# Patient Record
Sex: Female | Born: 1985 | Race: White | Hispanic: No | Marital: Married | Smoking: Never smoker
Health system: Southern US, Community
[De-identification: ages and names within clinical notes are randomized; demographics above are authoritative.]

---

## 2012-11-26 ENCOUNTER — Other Ambulatory Visit (HOSPITAL_COMMUNITY)
Admission: RE | Admit: 2012-11-26 | Discharge: 2012-11-26 | Disposition: A | Discharge status: Home / Self Care | Payer: BC Managed Care – PPO | Source: Ambulatory Visit | Attending: Family Medicine | Admitting: Family Medicine

## 2012-11-26 DIAGNOSIS — Z01419 Encounter for gynecological examination (general) (routine) without abnormal findings: Secondary | ICD-10-CM | POA: Insufficient documentation

## 2013-08-04 ENCOUNTER — Emergency Department (HOSPITAL_COMMUNITY)
Admission: EM | Admit: 2013-08-04 | Discharge: 2013-08-04 | Disposition: A | Discharge status: Home / Self Care | Payer: BC Managed Care – PPO | Attending: Emergency Medicine | Admitting: Emergency Medicine

## 2013-08-04 ENCOUNTER — Encounter (HOSPITAL_COMMUNITY): Payer: Self-pay | Admitting: Emergency Medicine

## 2013-08-04 DIAGNOSIS — S0993XA Unspecified injury of face, initial encounter: Secondary | ICD-10-CM | POA: Insufficient documentation

## 2013-08-04 DIAGNOSIS — Y9389 Activity, other specified: Secondary | ICD-10-CM | POA: Insufficient documentation

## 2013-08-04 DIAGNOSIS — S199XXA Unspecified injury of neck, initial encounter: Principal | ICD-10-CM

## 2013-08-04 DIAGNOSIS — Y92410 Unspecified street and highway as the place of occurrence of the external cause: Secondary | ICD-10-CM | POA: Diagnosis not present

## 2013-08-04 DIAGNOSIS — S99919A Unspecified injury of unspecified ankle, initial encounter: Secondary | ICD-10-CM | POA: Diagnosis not present

## 2013-08-04 DIAGNOSIS — S8990XA Unspecified injury of unspecified lower leg, initial encounter: Secondary | ICD-10-CM | POA: Insufficient documentation

## 2013-08-04 DIAGNOSIS — S99929A Unspecified injury of unspecified foot, initial encounter: Secondary | ICD-10-CM

## 2013-08-04 MED ORDER — NAPROXEN 500 MG PO TABS: 500.0000 mg | ORAL_TABLET | Freq: Two times a day (BID) | ORAL | Status: DC

## 2013-08-04 NOTE — ED Notes (Signed)
Patient declines wheelchair at discharge.  Patient escorted to lobby by RN.  
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
## 2013-08-05 NOTE — ED Provider Notes (Signed)
Medical screening examination/treatment/procedure(s) were performed by non-physician practitioner and as supervising physician I was immediately available for consultation/collaboration.   EKG Interpretation None       Elliott L Wentz, MD 08/05/13 1108 

## 2018-10-17 ENCOUNTER — Ambulatory Visit (HOSPITAL_COMMUNITY)
Admission: EM | Admit: 2018-10-17 | Discharge: 2018-10-17 | Disposition: A | Discharge status: Home / Self Care | Payer: BC Managed Care – PPO

## 2018-10-17 ENCOUNTER — Other Ambulatory Visit: Payer: Self-pay

## 2018-10-17 ENCOUNTER — Encounter (HOSPITAL_COMMUNITY): Payer: Self-pay

## 2018-10-17 DIAGNOSIS — L03115 Cellulitis of right lower limb: Secondary | ICD-10-CM | POA: Diagnosis not present

## 2018-10-17 MED ORDER — MUPIROCIN 2 % EX OINT: TOPICAL_OINTMENT | CUTANEOUS | 0 refills | Status: DC

## 2018-10-17 MED ORDER — AMOXICILLIN 500 MG PO CAPS: 500.0000 mg | ORAL_CAPSULE | Freq: Three times a day (TID) | ORAL | 0 refills | Status: DC

## 2018-10-17 NOTE — Discharge Instructions (Signed)
°  Please take antibiotics as prescribed and be sure to complete entire course even if you start to feel better to ensure infection does not come back.  Keep wound clean with warm water and antibacterial soap.  Change bandage at least 3 times a day the first 5 days while using the prescribed antibiotic ointment.  Please follow up in 4-5 days if not improving.
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
## 2018-10-17 NOTE — ED Provider Notes (Signed)
[facility]    CSN: [id number] Arrival date & time: 10/17/18  1824      History   Chief Complaint No chief complaint on file.   HPI Judith Colon is a 33 y.o. female.   HPI  Judith Colon is a 33 y.o. female presenting to UC with c/o 4 days of gradually worsening painful red bump on Right buttock/upper posterior thigh.  She has applied Neosporin with no relief.  She noticed clear drainage yesterday.  Pain is aching and sore. No known insect bite. Denies itching. Denies fever, chills, n/v/d.   No past medical history on file.  There are no active problems to display for this patient.   No past surgical history on file.  OB History   No obstetric history on file.      Home Medications    Prior to Admission medications   Medication Sig Start Date End Date Taking? Authorizing Provider  sertraline (ZOLOFT) 25 MG tablet Take 25 mg by mouth daily.   Yes [provider]  amoxicillin (AMOXIL) 500 MG capsule Take 1 capsule (500 mg total) by mouth 3 (three) times daily. 10/17/18   Lurene ShadowPhelps, Luma Clopper O, PA-C  mupirocin ointment (BACTROBAN) 2 % Apply to wound 3 times daily for 5 days 10/17/18   Lurene ShadowPhelps, Gustie Bobb O, PA-C  naproxen (NAPROSYN) 500 MG tablet Take 1 tablet (500 mg total) by mouth 2 (two) times daily. 08/04/13   Arthor CaptainHarris, Abigail, PA-C    Family History No family history on file.  Social History Social History   Tobacco Use  . Smoking status: Never Smoker  . Smokeless tobacco: Never Used  Substance Use Topics  . Alcohol use: Yes    Comment: 2-3 times a week  . Drug use: No     Allergies   Patient has no known allergies.   Review of Systems Review of Systems  Gastrointestinal: Negative for diarrhea, nausea and vomiting.  Skin: Positive for color change, rash and wound.     Physical Exam Triage Vital Signs ED Triage Vitals  Enc Vitals Group     BP 10/17/18 1839 136/70     Pulse Rate 10/17/18 1839 88     Resp 10/17/18 1839 16     Temp 10/17/18  1839 98.9 F (37.2 C)     Temp Source 10/17/18 1839 Oral     SpO2 10/17/18 1839 100 %     Weight --      Height --      Head Circumference --      Peak Flow --      Pain Score 10/17/18 1843 4     Pain Loc --      Pain Edu? --      Excl. in GC? --    No data found.  Updated Vital Signs BP 136/70 (BP Location: Right Arm)   Pulse 88   Temp 98.9 F (37.2 C) (Oral)   Resp 16   LMP 10/03/2018   SpO2 100%   Visual Acuity Right Eye Distance:   Left Eye Distance:   Bilateral Distance:    Right Eye Near:   Left Eye Near:    Bilateral Near:     Physical Exam Vitals signs and nursing note reviewed.  Constitutional:      Appearance: Normal appearance. She is well-developed.  HENT:     Head: Normocephalic and atraumatic.  Neck:     Musculoskeletal: Normal range of motion.  Cardiovascular:     Rate and  Rhythm: Normal rate.  Pulmonary:     Effort: Pulmonary effort is normal.  Musculoskeletal: Normal range of motion.  Skin:    General: Skin is warm and dry.     Findings: Erythema present.       Neurological:     Mental Status: She is alert and oriented to person, place, and time.  Psychiatric:        Behavior: Behavior normal.      UC Treatments / Results  Labs (all labs ordered are listed, but only abnormal results are displayed) Labs Reviewed - No data to display  EKG   Radiology No results found.  Procedures Procedures (including critical care time)  Medications Ordered in UC Medications - No data to display  Initial Impression / Assessment and Plan / UC Course  I have reviewed the triage vital signs and the nursing notes.  Pertinent labs & imaging results that were available during my care of the patient were reviewed by me and considered in my medical decision making (see chart for details).     Exam c/w cellulitis Will start on antibiotics AVS provided.  Final Clinical Impressions(s) / UC Diagnoses   Final diagnoses:  Cellulitis of right  thigh     Discharge Instructions      Please take antibiotics as prescribed and be sure to complete entire course even if you start to feel better to ensure infection does not come back.  Keep wound clean with warm water and antibacterial soap.  Change bandage at least 3 times a day the first 5 days while using the prescribed antibiotic ointment.  Please follow up in 4-5 days if not improving.      ED Prescriptions    Medication Sig Dispense Auth. Provider   mupirocin ointment (BACTROBAN) 2 % Apply to wound 3 times daily for 5 days 22 g Hawthorne Day O, PA-C   amoxicillin (AMOXIL) 500 MG capsule Take 1 capsule (500 mg total) by mouth 3 (three) times daily. 21 capsule Noe Gens, PA-C     Controlled Substance Prescriptions [place] Controlled Substance Registry consulted? Not Applicable   Tyrell Antonio 10/17/18 1948

## 2018-10-21 ENCOUNTER — Ambulatory Visit (HOSPITAL_COMMUNITY)
Admission: EM | Admit: 2018-10-21 | Discharge: 2018-10-21 | Disposition: A | Discharge status: Home / Self Care | Payer: BC Managed Care – PPO | Attending: Family Medicine | Admitting: Family Medicine

## 2018-10-21 ENCOUNTER — Other Ambulatory Visit: Payer: Self-pay

## 2018-10-21 ENCOUNTER — Encounter (HOSPITAL_COMMUNITY): Payer: Self-pay

## 2018-10-21 DIAGNOSIS — L02416 Cutaneous abscess of left lower limb: Secondary | ICD-10-CM

## 2018-10-21 MED ORDER — CLINDAMYCIN HCL 300 MG PO CAPS: 300.0000 mg | ORAL_CAPSULE | Freq: Three times a day (TID) | ORAL | 0 refills | Status: AC

## 2018-10-21 MED ORDER — HYDROCODONE-ACETAMINOPHEN 5-325 MG PO TABS: 1.0000 | ORAL_TABLET | Freq: Four times a day (QID) | ORAL | 0 refills | Status: DC | PRN

## 2018-10-21 MED ORDER — LIDOCAINE HCL 2 % IJ SOLN
INTRAMUSCULAR | Status: AC
  Filled 2018-10-21: qty 20

## 2018-10-21 NOTE — ED Triage Notes (Signed)
Patient presents to Urgent Care with complaints of needing follow-up for a draining abscess since it was cared for here 3 days ago. Patient reports her father is a Software engineer and wanted to relay the message that she would be better with clindamycin versus the amoxicillin she was prescribed.

## 2018-10-21 NOTE — ED Provider Notes (Signed)
[facility]    CSN: [id number] Arrival date & time: 10/21/18  1924      History   Chief Complaint Chief Complaint  Patient presents with  . Abscess  . Follow-up    HPI Judith Colon is a 33 y.o. female no significant past medical history presenting today for follow-up of an abscess.  Patient was seen here approximately 4 to 5 days ago and treated for cellulitis with amoxicillin.  She is following up today has she is still having persistent issues and the area has started to drain.  She states her pain has improved, but overall the area looks worse.  She denies any fevers chills body aches.  Denies any nausea or vomiting.  Denies lightheadedness or dizziness.  Denies history of previous abscesses.  HPI  History reviewed. No pertinent past medical history.  There are no active problems to display for this patient.   History reviewed. No pertinent surgical history.  OB History   No obstetric history on file.      Home Medications    Prior to Admission medications   Medication Sig Start Date End Date Taking? Authorizing Provider  clindamycin (CLEOCIN) 300 MG capsule Take 1 capsule (300 mg total) by mouth 3 (three) times daily for 7 days. 10/21/18 10/28/18  ,  C, PA-C  HYDROcodone-acetaminophen (NORCO/VICODIN) 5-325 MG tablet Take 1-2 tablets by mouth every 6 (six) hours as needed for severe pain. 10/21/18   ,  C, PA-C  mupirocin ointment (BACTROBAN) 2 % Apply to wound 3 times daily for 5 days 10/17/18   Lurene ShadowPhelps, Erin O, PA-C  naproxen (NAPROSYN) 500 MG tablet Take 1 tablet (500 mg total) by mouth 2 (two) times daily. 08/04/13   Arthor CaptainHarris, Abigail, PA-C  sertraline (ZOLOFT) 25 MG tablet Take 25 mg by mouth daily.    [provider]    Family History Family History  Problem Relation Age of Onset  . Healthy Mother   . Healthy Father     Social History Social History   Tobacco Use  . Smoking status: Never Smoker  . Smokeless tobacco:  Never Used  Substance Use Topics  . Alcohol use: Yes    Comment: 2-3 times a week  . Drug use: No     Allergies   Patient has no known allergies.   Review of Systems Review of Systems  Constitutional: Negative for fatigue and fever.  Eyes: Negative for visual disturbance.  Respiratory: Negative for shortness of breath.   Cardiovascular: Negative for chest pain.  Gastrointestinal: Negative for abdominal pain, nausea and vomiting.  Musculoskeletal: Negative for arthralgias and joint swelling.  Skin: Positive for color change and wound. Negative for rash.  Neurological: Negative for dizziness, weakness, light-headedness and headaches.     Physical Exam Triage Vital Signs ED Triage Vitals  Enc Vitals Group     BP 10/21/18 2019 119/63     Pulse Rate 10/21/18 2019 78     Resp 10/21/18 2019 17     Temp 10/21/18 2019 98.7 F (37.1 C)     Temp Source 10/21/18 2019 Temporal     SpO2 10/21/18 2019 100 %     Weight --      Height --      Head Circumference --      Peak Flow --      Pain Score 10/21/18 2018 3     Pain Loc --      Pain Edu? --  Excl. in GC? --    No data found.  Updated Vital Signs BP 119/63 (BP Location: Left Arm)   Pulse 78   Temp 98.7 F (37.1 C) (Temporal)   Resp 17   LMP 10/03/2018   SpO2 100%   Visual Acuity Right Eye Distance:   Left Eye Distance:   Bilateral Distance:    Right Eye Near:   Left Eye Near:    Bilateral Near:     Physical Exam Vitals signs and nursing note reviewed.  Constitutional:      Appearance: She is well-developed.     Comments: No acute distress  HENT:     Head: Normocephalic and atraumatic.     Nose: Nose normal.  Eyes:     Conjunctiva/sclera: Conjunctivae normal.  Neck:     Musculoskeletal: Neck supple.  Cardiovascular:     Rate and Rhythm: Normal rate.  Pulmonary:     Effort: Pulmonary effort is normal. No respiratory distress.  Abdominal:     General: There is no distension.  Musculoskeletal:  Normal range of motion.  Skin:    General: Skin is warm and dry.     Comments: Large area of induration and erythema to posterior right leg.  Central area slightly open with slight dried pus, does not extend to groin  Neurological:     Mental Status: She is alert and oriented to person, place, and time.      UC Treatments / Results  Labs (all labs ordered are listed, but only abnormal results are displayed) Labs Reviewed - No data to display  EKG   Radiology No results found.  Procedures Incision and Drainage  Date/Time: 10/21/2018 9:30 PM Performed by: , Junius Creamer, PA-C Authorized by: Eustace Moore, MD   Consent:    Consent obtained:  Verbal   Consent given by:  Patient   Risks discussed:  Bleeding, incomplete drainage and pain   Alternatives discussed:  Alternative treatment Location:    Type:  Abscess   Size:  5   Location:  Lower extremity   Lower extremity location:  Leg   Leg location:  R upper leg Pre-procedure details:    Skin preparation:  Betadine Anesthesia (see MAR for exact dosages):    Anesthesia method:  Local infiltration   Local anesthetic:  Lidocaine 2% w/o epi Procedure type:    Complexity:  Simple Procedure details:    Needle aspiration: no     Incision types:  Single straight   Incision depth:  Subcutaneous   Scalpel blade:  11   Wound management:  Probed and deloculated   Drainage:  Purulent and bloody   Drainage amount:  Copious   Wound treatment:  Wound left open   Packing materials:  1/4 in iodoform gauze Post-procedure details:    Patient tolerance of procedure:  Tolerated well, no immediate complications   (including critical care time)  Medications Ordered in UC Medications  lidocaine (XYLOCAINE) 2 % (with pres) injection (has no administration in time range)    Initial Impression / Assessment and Plan / UC Course  I have reviewed the triage vital signs and the nursing notes.  Pertinent labs & imaging results  that were available during my care of the patient were reviewed by me and considered in my medical decision making (see chart for details).     I&D performed on abscess.  Large amount of packing placed.  Switching from amoxicillin to clindamycin to cover for MRSA.  Recommended starting probiotic  to avoid C. difficile.  Warm compresses].  Packing to be slowly removed every 24 hours given size of abscess.  Fully remove packing in 72 hours.  Follow-up if symptoms not resolving or worsening.  Discussed strict return precautions. Patient verbalized understanding and is agreeable with plan.  Final Clinical Impressions(s) / UC Diagnoses   Final diagnoses:  Abscess of left leg     Discharge Instructions     Please begin clindamycin x 1 week  Apply warm compresses/hot rags to area with massage to express further drainage especially the first 24-48 hours  Return if symptoms returning or not improving   ED Prescriptions    Medication Sig Dispense Auth. Provider   clindamycin (CLEOCIN) 300 MG capsule Take 1 capsule (300 mg total) by mouth 3 (three) times daily for 7 days. 21 capsule ,  C, PA-C   HYDROcodone-acetaminophen (NORCO/VICODIN) 5-325 MG tablet Take 1-2 tablets by mouth every 6 (six) hours as needed for severe pain. 8 tablet , [place] C, PA-C     Controlled Substance Prescriptions [place] Controlled Substance Registry consulted? Yes, I have consulted the [place] Controlled Substances Registry for this patient, and feel the risk/benefit ratio today is favorable for proceeding with this prescription for a controlled substance.   Janith Lima, [place] 10/21/18 2132

## 2018-10-21 NOTE — Discharge Instructions (Addendum)
Please begin clindamycin x 1 week  Apply warm compresses/hot rags to area with massage to express further drainage especially the first 24-48 hours  Return if symptoms returning or not improving

## 2019-04-30 ENCOUNTER — Ambulatory Visit: Payer: BC Managed Care – PPO | Attending: Internal Medicine

## 2019-04-30 DIAGNOSIS — Z23 Encounter for immunization: Secondary | ICD-10-CM

## 2019-04-30 NOTE — Progress Notes (Signed)
   Covid-19 Vaccination Clinic  Name:  Judith Colon    MRN: [id number] DOB: Nov 30, 1985  04/30/2019  [name] was observed post Covid-19 immunization for 15 minutes without incident. She was provided with Vaccine Information Sheet and instruction to access the V-Safe system.   [name] was instructed to call 911 with any severe reactions post vaccine: Marland Kitchen Difficulty breathing  . Swelling of face and throat  . A fast heartbeat  . A bad rash all over body  . Dizziness and weakness   Immunizations Administered    Name Date Dose VIS Date Route   Pfizer COVID-19 Vaccine 04/30/2019  3:23 PM 0.3 mL 01/30/2019 Intramuscular   Manufacturer: [employer], [employer]   Lot: [id number]   NDC: [address]-5

## 2019-05-25 ENCOUNTER — Ambulatory Visit: Payer: BC Managed Care – PPO | Attending: Internal Medicine

## 2019-05-25 DIAGNOSIS — Z23 Encounter for immunization: Secondary | ICD-10-CM

## 2019-05-25 NOTE — Progress Notes (Signed)
   Covid-19 Vaccination Clinic  Name:  Judith Colon    MRN: [id number] DOB: 1985/05/11  05/25/2019  [name] was observed post Covid-19 immunization for 15 minutes without incident. She was provided with Vaccine Information Sheet and instruction to access the V-Safe system.   [name] was instructed to call 911 with any severe reactions post vaccine: Marland Kitchen Difficulty breathing  . Swelling of face and throat  . A fast heartbeat  . A bad rash all over body  . Dizziness and weakness   Immunizations Administered    Name Date Dose VIS Date Route   Pfizer COVID-19 Vaccine 05/25/2019 11:06 AM 0.3 mL 01/30/2019 Intramuscular   Manufacturer: [employer], [employer]   Lot: [id number]   NDC: [address]-5

## 2019-06-30 ENCOUNTER — Other Ambulatory Visit (HOSPITAL_COMMUNITY)
Admission: RE | Admit: 2019-06-30 | Discharge: 2019-06-30 | Disposition: A | Discharge status: Home / Self Care | Payer: BC Managed Care – PPO | Source: Ambulatory Visit | Attending: Family Medicine | Admitting: Family Medicine

## 2019-06-30 ENCOUNTER — Other Ambulatory Visit: Payer: Self-pay | Admitting: Family Medicine

## 2019-06-30 DIAGNOSIS — Z124 Encounter for screening for malignant neoplasm of cervix: Secondary | ICD-10-CM | POA: Insufficient documentation

## 2019-07-02 LAB — CYTOLOGY - PAP
Adequacy: ABSENT
Comment: NEGATIVE
Diagnosis: NEGATIVE
High risk HPV: NEGATIVE

## 2019-12-02 ENCOUNTER — Other Ambulatory Visit: Payer: BC Managed Care – PPO

## 2019-12-02 ENCOUNTER — Other Ambulatory Visit: Payer: Self-pay

## 2019-12-02 DIAGNOSIS — Z20822 Contact with and (suspected) exposure to covid-19: Secondary | ICD-10-CM

## 2019-12-03 LAB — SARS-COV-2, NAA 2 DAY TAT

## 2019-12-03 LAB — NOVEL CORONAVIRUS, NAA: SARS-CoV-2, NAA: NOT DETECTED

## 2020-08-05 ENCOUNTER — Emergency Department (HOSPITAL_COMMUNITY): Payer: BC Managed Care – PPO

## 2020-08-05 ENCOUNTER — Encounter (HOSPITAL_COMMUNITY): Payer: Self-pay | Admitting: Emergency Medicine

## 2020-08-05 ENCOUNTER — Emergency Department (HOSPITAL_COMMUNITY)
Admission: EM | Admit: 2020-08-05 | Discharge: 2020-08-05 | Disposition: A | Discharge status: Home / Self Care | Payer: BC Managed Care – PPO | Attending: Emergency Medicine | Admitting: Emergency Medicine

## 2020-08-05 DIAGNOSIS — G932 Benign intracranial hypertension: Secondary | ICD-10-CM | POA: Insufficient documentation

## 2020-08-05 DIAGNOSIS — H53132 Sudden visual loss, left eye: Secondary | ICD-10-CM | POA: Diagnosis present

## 2020-08-05 LAB — CBC WITH DIFFERENTIAL/PLATELET
Abs Immature Granulocytes: 0.01 10*3/uL (ref 0.00–0.07)
Basophils Absolute: 0.1 10*3/uL (ref 0.0–0.1)
Basophils Relative: 1 %
Eosinophils Absolute: 0.2 10*3/uL (ref 0.0–0.5)
Eosinophils Relative: 2 %
HCT: 37.5 % (ref 36.0–46.0)
Hemoglobin: 11.9 g/dL — ABNORMAL LOW (ref 12.0–15.0)
Immature Granulocytes: 0 %
Lymphocytes Relative: 34 %
Lymphs Abs: 2.5 10*3/uL (ref 0.7–4.0)
MCH: 27.5 pg (ref 26.0–34.0)
MCHC: 31.7 g/dL (ref 30.0–36.0)
MCV: 86.6 fL (ref 80.0–100.0)
Monocytes Absolute: 0.5 10*3/uL (ref 0.1–1.0)
Monocytes Relative: 7 %
Neutro Abs: 4.1 10*3/uL (ref 1.7–7.7)
Neutrophils Relative %: 56 %
Platelets: 241 10*3/uL (ref 150–400)
RBC: 4.33 MIL/uL (ref 3.87–5.11)
RDW: 12.8 % (ref 11.5–15.5)
WBC: 7.3 10*3/uL (ref 4.0–10.5)
nRBC: 0 % (ref 0.0–0.2)

## 2020-08-05 LAB — CSF CELL COUNT WITH DIFFERENTIAL
RBC Count, CSF: 0 /mm3
RBC Count, CSF: 2 /mm3 — ABNORMAL HIGH
Tube #: 1
Tube #: 4
WBC, CSF: 1 /mm3 (ref 0–5)
WBC, CSF: 1 /mm3 (ref 0–5)

## 2020-08-05 LAB — BASIC METABOLIC PANEL
Anion gap: 8 (ref 5–15)
BUN: 10 mg/dL (ref 6–20)
CO2: 23 mmol/L (ref 22–32)
Calcium: 9.1 mg/dL (ref 8.9–10.3)
Chloride: 108 mmol/L (ref 98–111)
Creatinine, Ser: 0.91 mg/dL (ref 0.44–1.00)
GFR, Estimated: >60 mL/min (ref >60)
Glucose, Bld: 100 mg/dL — ABNORMAL HIGH (ref 70–99)
Potassium: 4.1 mmol/L (ref 3.5–5.1)
Sodium: 139 mmol/L (ref 135–145)

## 2020-08-05 LAB — PROTEIN, CSF: Total  Protein, CSF: 26 mg/dL (ref 15–45)

## 2020-08-05 LAB — GLUCOSE, CSF: Glucose, CSF: 55 mg/dL (ref 40–70)

## 2020-08-05 MED ORDER — ACETAZOLAMIDE 250 MG PO TABS: 500.0000 mg | ORAL_TABLET | Freq: Two times a day (BID) | ORAL | 0 refills | Status: DC

## 2020-08-05 MED ORDER — LIDOCAINE HCL (PF) 1 % IJ SOLN
5.0000 mL | Freq: Once | INTRAMUSCULAR | Status: AC
  Administered 2020-08-05: 3 mL

## 2020-08-05 MED ORDER — SODIUM CHLORIDE 0.9 % IV BOLUS
1000.0000 mL | Freq: Once | INTRAVENOUS | Status: AC
  Administered 2020-08-05: 1000 mL via INTRAVENOUS

## 2020-08-05 MED ORDER — GADOBUTROL 1 MMOL/ML IV SOLN
10.0000 mL | Freq: Once | INTRAVENOUS | Status: AC | PRN
  Administered 2020-08-05: 10 mL via INTRAVENOUS

## 2020-08-05 NOTE — ED Provider Notes (Signed)
Care of the patient assumed at the change of shift. Here for visual changes and papilledema seen on outpatient ophtho exam. 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   I discussed the case with Dr. Otelia Limes with Neurology who recommends initiating Diamox 500mg  PO BID with close out patient follow up with Ophtho and Neurology for monitoring of pressures and long term management of her symptoms. Patient understands that poorly controlled intracranial pressures could lead to permanent blindness. She was counseled to lose weight as this may also help prevent symptoms.   Encouraged to return to the ED for any other concerns.    , MD 08/05/20 [phone]

## 2020-08-05 NOTE — ED Provider Notes (Signed)
Emergency Medicine Provider Triage Evaluation Note  Judith Colon , a 35 y.o. female  was evaluated in triage. Sent from ophthalmology for evaluation of optic nerve edema bilaterally. For the past month whenever she stands up or bends over her vision in her left eye will go out for a couple of seconds and then return. She sometimes has the same thing in her right eye. Saw ophthalmology today and was sent here for further workup. See below for recommendations. Pt also has paperwork with her for recommendations.   Review of Systems  Positive: + vision loss in left eye with position change, headaches Negative: - fevers, confusion  Physical Exam  BP 140/78 (BP Location: Right Arm)   Pulse 76   Temp 98.7 F (37.1 C) (Oral)   Resp 16   SpO2 97%  Gen:   Awake, no distress   Resp:  Normal effort  MSK:   Moves extremities without difficulty  Other:    Medical Decision Making  Medically screening exam initiated at 10:17 AM.  Appropriate orders placed.  Judith Colon was informed that the remainder of the evaluation will be completed by another provider, this initial triage assessment does not replace that evaluation, and the importance of remaining in the ED until their evaluation is complete.  Sent from [facility] for MRI Brain with and without, MRI orbits with and without, and MRV with concern for possible IIH but need to rule out CNS Mass and CVST. Possible LP if negative to confirm IIH if MRI negative.    Tanda Rockers, PA-C 08/05/20 1024    Rolan Bucco, MD 08/05/20 1106

## 2020-08-05 NOTE — ED Triage Notes (Signed)
Pt here sent over by eye md for optic nerve edema for MRI and possible LP , no h/a blurred vision in both eyes left is worse than right ,

## 2020-08-05 NOTE — ED Notes (Signed)
Pt transported to interventional radiology for LP procedure. Signed informed consent form at bedside.

## 2020-08-05 NOTE — ED Provider Notes (Signed)
[facility] EMERGENCY DEPARTMENT Provider Note   CSN: [id number] Arrival date & time: 08/05/20  1005     History No chief complaint on file.   Judith Colon is a 34 y.o. female who presents to the ED today for further evaluation of optic nerve edema bilaterally. Pt sent here from [facility] with recommendations for MRI Brain with/with out, MRI orbits with/without, and MV for concern for possible IIH but need to rule out CNS Mass and CVST with possible LP if negative to confirm IIH if MRI negative.   Pt reports for the last month she has been having intermittent vision loss in her left eye mostly with standing up from seated position or bending over. The vision loss will last a couple of seconds and then resolve. She states at times it will be in her right eye as well however seems worse in her left. She went to see ophthalmology today and was sent here for further evaluation. No other complaints at this time.   The history is provided by the patient and medical records.      No past medical history on file.  There are no problems to display for this patient.   No past surgical history on file.   OB History   No obstetric history on file.     Family History  Problem Relation Age of Onset   Healthy Mother    Healthy Father     Social History   Tobacco Use   Smoking status: Never   Smokeless tobacco: Never  Vaping Use   Vaping Use: Never used  Substance Use Topics   Alcohol use: Yes    Comment: 2-3 times a week   Drug use: No    Home Medications Prior to Admission medications   Medication Sig Start Date End Date Taking? Authorizing Provider  B Complex Vitamins (B COMPLEX 1 PO) Take 1 tablet by mouth daily.   Yes [provider]  Multiple Vitamin (MULTIVITAMIN ADULT PO) Take 1 tablet by mouth daily.   Yes [provider]  POTASSIUM CHLORIDE PO Take 1 tablet by mouth daily.   Yes [provider]  Turmeric (QC  TUMERIC COMPLEX PO) Take 1 tablet by mouth daily.   Yes [provider]  ZINC CITRATE PO Take 1 tablet by mouth daily.   Yes [provider]  HYDROcodone-acetaminophen (NORCO/VICODIN) 5-325 MG tablet Take 1-2 tablets by mouth every 6 (six) hours as needed for severe pain. Patient not taking: No sig reported 10/21/18   Wieters, Fran Lowes C, PA-C  mupirocin ointment (BACTROBAN) 2 % Apply to wound 3 times daily for 5 days Patient not taking: No sig reported 10/17/18   Lurene Shadow, PA-C  naproxen (NAPROSYN) 500 MG tablet Take 1 tablet (500 mg total) by mouth 2 (two) times daily. Patient not taking: No sig reported 08/04/13   Arthor Captain, PA-C  sertraline (ZOLOFT) 25 MG tablet Take 25 mg by mouth daily. Patient not taking: No sig reported    [provider]    Allergies    Patient has no known allergies.  Review of Systems   Review of Systems  Constitutional:  Negative for chills and fever.  Eyes:  Positive for visual disturbance.  Gastrointestinal:  Negative for nausea and vomiting.  Neurological:  Negative for seizures, syncope, speech difficulty, weakness, numbness and headaches.  All other systems reviewed and are negative.  Physical Exam Updated Vital Signs BP 119/77 (BP Location: Right Arm)  Pulse 73   Temp 98.7 F (37.1 C) (Oral)   Resp 18   SpO2 100%   Physical Exam Vitals and nursing note reviewed.  Constitutional:      Appearance: She is obese. She is not ill-appearing or diaphoretic.  HENT:     Head: Normocephalic and atraumatic.  Eyes:     Conjunctiva/sclera: Conjunctivae normal.  Cardiovascular:     Rate and Rhythm: Normal rate and regular rhythm.     Pulses: Normal pulses.  Pulmonary:     Effort: Pulmonary effort is normal.     Breath sounds: Normal breath sounds. No wheezing, rhonchi or rales.  Abdominal:     Palpations: Abdomen is soft.     Tenderness: There is no abdominal tenderness.  Musculoskeletal:     Cervical back: Neck  supple.  Skin:    General: Skin is warm and dry.  Neurological:     Mental Status: She is alert.     Comments: Alert and oriented to self, place, time and event.   Speech is fluent, clear without dysarthria or dysphasia.   Strength 5/5 in upper/lower extremities   Sensation intact in upper/lower extremities   Normal gait.  Negative Romberg. No pronator drift.  Normal finger-to-nose and feet tapping.  CN I not tested  CN II grossly intact visual fields bilaterally. Did not visualize posterior eye.  CN III, IV, VI PERRLA and EOMs intact bilaterally  CN V Intact sensation to sharp and light touch to the face  CN VII facial movements symmetric  CN VIII not tested  CN IX, X no uvula deviation, symmetric rise of soft palate  CN XI 5/5 SCM and trapezius strength bilaterally  CN XII Midline tongue protrusion, symmetric L/R movements      ED Results / Procedures / Treatments   Labs (all labs ordered are listed, but only abnormal results are displayed) Labs Reviewed  BASIC METABOLIC PANEL - Abnormal; Notable for the following components:      Result Value   Glucose, Bld 100 (*)    All other components within normal limits  CBC WITH DIFFERENTIAL/PLATELET - Abnormal; Notable for the following components:   Hemoglobin 11.9 (*)    All other components within normal limits  CSF CULTURE W GRAM STAIN  CSF CULTURE W GRAM STAIN  CSF CELL COUNT WITH DIFFERENTIAL  PROTEIN AND GLUCOSE, CSF  GLUCOSE, CSF  PROTEIN, CSF  CSF CELL COUNT WITH DIFFERENTIAL    EKG None  Radiology MR Brain W and Wo Contrast  Result Date: 08/05/2020 CLINICAL DATA:  Optic nerve edema EXAM: MRI HEAD AND ORBITS WITHOUT AND WITH CONTRAST MRV HEAD WITHOUT AND WITH CONTRAST TECHNIQUE: Multiplanar, multiecho pulse sequences of the brain and surrounding structures were obtained without and with intravenous contrast. Multiplanar, multiecho pulse sequences of the orbits and surrounding structures were obtained including  fat saturation techniques, before and after intravenous contrast administration. MRV of the head was performed without and with intravenous contrast. Maximum intensity projection images were created for better evaluation of the vessels. CONTRAST:  42mL GADAVIST GADOBUTROL 1 MMOL/ML IV SOLN COMPARISON:  None. FINDINGS: MRI HEAD Brain: There is no acute infarction or intracranial hemorrhage. Ventricles and sulci are normal in size and configuration. There is no intracranial mass, mass effect, hydrocephalus, or extra-axial collection. No abnormal enhancement. Vascular: Major vessel flow voids at the skull base are preserved. Skull and upper cervical spine: Marrow signal is within normal limits. Other: Mastoid air cells are clear.  Sella is unremarkable. MRI  ORBITS Orbits: There is no proptosis. No intraorbital mass. Possible mild protrusion of the left optic nerve head at the posterior globe in keeping with reported papilledema. Globes, extraocular muscles, and lacrimal glands are symmetric and unremarkable. There is no abnormal enhancement of the optic nerves. Visualized sinuses: Minor mucosal thickening. Soft tissues: Unremarkable. MRV HEAD Superior sagittal sinus, straight sinus, vein of Galen, and internal cerebral veins are patent. Transverse and sigmoid sinuses are patent. There is bilateral stenosis of the distal transverse sinuses. No evidence of sinus thrombosis. IMPRESSION: No mass or abnormal enhancement.  No evidence of venous thrombosis. Bilateral distal transverse sinus stenosis. Nonspecific but can be seen in the setting of idiopathic intracranial hypertension. Additional associated finding of an empty sella is not present. Electronically Signed   By: Guadlupe SpanishPraneil  Patel M.D.   On: 08/05/2020 12:33   MR MRV HEAD W WO CONTRAST  Result Date: 08/05/2020 CLINICAL DATA:  Optic nerve edema EXAM: MRI HEAD AND ORBITS WITHOUT AND WITH CONTRAST MRV HEAD WITHOUT AND WITH CONTRAST TECHNIQUE: Multiplanar, multiecho  pulse sequences of the brain and surrounding structures were obtained without and with intravenous contrast. Multiplanar, multiecho pulse sequences of the orbits and surrounding structures were obtained including fat saturation techniques, before and after intravenous contrast administration. MRV of the head was performed without and with intravenous contrast. Maximum intensity projection images were created for better evaluation of the vessels. CONTRAST:  10mL GADAVIST GADOBUTROL 1 MMOL/ML IV SOLN COMPARISON:  None. FINDINGS: MRI HEAD Brain: There is no acute infarction or intracranial hemorrhage. Ventricles and sulci are normal in size and configuration. There is no intracranial mass, mass effect, hydrocephalus, or extra-axial collection. No abnormal enhancement. Vascular: Major vessel flow voids at the skull base are preserved. Skull and upper cervical spine: Marrow signal is within normal limits. Other: Mastoid air cells are clear.  Sella is unremarkable. MRI ORBITS Orbits: There is no proptosis. No intraorbital mass. Possible mild protrusion of the left optic nerve head at the posterior globe in keeping with reported papilledema. Globes, extraocular muscles, and lacrimal glands are symmetric and unremarkable. There is no abnormal enhancement of the optic nerves. Visualized sinuses: Minor mucosal thickening. Soft tissues: Unremarkable. MRV HEAD Superior sagittal sinus, straight sinus, vein of Galen, and internal cerebral veins are patent. Transverse and sigmoid sinuses are patent. There is bilateral stenosis of the distal transverse sinuses. No evidence of sinus thrombosis. IMPRESSION: No mass or abnormal enhancement.  No evidence of venous thrombosis. Bilateral distal transverse sinus stenosis. Nonspecific but can be seen in the setting of idiopathic intracranial hypertension. Additional associated finding of an empty sella is not present. Electronically Signed   By: Guadlupe SpanishPraneil  Patel M.D.   On: 08/05/2020 12:33    DG FL GUIDED LUMBAR PUNCTURE  Result Date: 08/05/2020 CLINICAL DATA:  Intracranial pressure increased. EXAM: DIAGNOSTIC LUMBAR PUNCTURE UNDER FLUOROSCOPIC GUIDANCE COMPARISON:  MRI/MRV head and MRI of the orbits 08/05/2020 FLUOROSCOPY TIME:  Fluoroscopy Time:  30 seconds Radiation Exposure Index (if provided by the fluoroscopic device): None Number of Acquired Spot Images: None PROCEDURE: Informed consent was obtained from the patient prior to the procedure, including a discussion of potential complications including but not limited to headache, allergy, pain, infection, bleeding, spinal cord/nerve root damage. With the patient prone, the lower back was prepped with Betadine. 1% Lidocaine was used for local anesthesia. Lumbar puncture was performed at the L4-L5 level using a 20 gauge needle with return of clear CSF with an opening pressure of 40 cm water. 27 ml  of CSF were obtained for laboratory studies. Closing pressure 16 cm water. The patient tolerated the procedure well without immediate post-procedure complication. IMPRESSION: Technically successful fluoroscopically guided L4-L5 lumbar puncture. The patient tolerated the procedure well without immediate postprocedure complication. Opening pressure 40 cm water. 27 mL of CSF obtained for laboratory studies. Closing pressure 16 cm water. Electronically Signed   By: Jackey Loge DO   On: 08/05/2020 18:39   MR ORBITS W WO CONTRAST  Result Date: 08/05/2020 CLINICAL DATA:  Optic nerve edema EXAM: MRI HEAD AND ORBITS WITHOUT AND WITH CONTRAST MRV HEAD WITHOUT AND WITH CONTRAST TECHNIQUE: Multiplanar, multiecho pulse sequences of the brain and surrounding structures were obtained without and with intravenous contrast. Multiplanar, multiecho pulse sequences of the orbits and surrounding structures were obtained including fat saturation techniques, before and after intravenous contrast administration. MRV of the head was performed without and with intravenous  contrast. Maximum intensity projection images were created for better evaluation of the vessels. CONTRAST:  28mL GADAVIST GADOBUTROL 1 MMOL/ML IV SOLN COMPARISON:  None. FINDINGS: MRI HEAD Brain: There is no acute infarction or intracranial hemorrhage. Ventricles and sulci are normal in size and configuration. There is no intracranial mass, mass effect, hydrocephalus, or extra-axial collection. No abnormal enhancement. Vascular: Major vessel flow voids at the skull base are preserved. Skull and upper cervical spine: Marrow signal is within normal limits. Other: Mastoid air cells are clear.  Sella is unremarkable. MRI ORBITS Orbits: There is no proptosis. No intraorbital mass. Possible mild protrusion of the left optic nerve head at the posterior globe in keeping with reported papilledema. Globes, extraocular muscles, and lacrimal glands are symmetric and unremarkable. There is no abnormal enhancement of the optic nerves. Visualized sinuses: Minor mucosal thickening. Soft tissues: Unremarkable. MRV HEAD Superior sagittal sinus, straight sinus, vein of Galen, and internal cerebral veins are patent. Transverse and sigmoid sinuses are patent. There is bilateral stenosis of the distal transverse sinuses. No evidence of sinus thrombosis. IMPRESSION: No mass or abnormal enhancement.  No evidence of venous thrombosis. Bilateral distal transverse sinus stenosis. Nonspecific but can be seen in the setting of idiopathic intracranial hypertension. Additional associated finding of an empty sella is not present. Electronically Signed   By: Guadlupe Spanish M.D.   On: 08/05/2020 12:33    Procedures Procedures   Medications Ordered in ED Medications  gadobutrol (GADAVIST) 1 MMOL/ML injection 10 mL (10 mLs Intravenous Contrast Given 08/05/20 1134)  sodium chloride 0.9 % bolus 1,000 mL (1,000 mLs Intravenous New Bag/Given 08/05/20 1638)  lidocaine (PF) (XYLOCAINE) 1 % injection 5 mL (3 mLs Other Given by Other 08/05/20 1705)     ED Course  I have reviewed the triage vital signs and the nursing notes.  Pertinent labs & imaging results that were available during my care of the patient were reviewed by me and considered in my medical decision making (see chart for details).     MDM Rules/Calculators/A&P                          35 year old female who presents to the ED from ophthalmology with concern for possible IIH with recommendations for MRI/possible LP if no acute findings on MRI. On arrival to the ED pt is afebrile, nontachycardic, and nontachypneic and appears to be in NAD. She was medically screened by myself in triage and workup started with MRIs and lab work.   MRIs: IMPRESSION:  No mass or abnormal enhancement.  No evidence of venous thrombosis.     Bilateral distal transverse sinus stenosis. Nonspecific but can be  seen in the setting of idiopathic intracranial hypertension.  Additional associated finding of an empty sella is not present.   Will proceed with LP at this time.   LP attempted with Dr. Fredderick Phenix at bedside; unfortunately unsuccessful and pt began feeling lightheaded during procedure so it was stopped. Have discussed with radiologist Dr. Eppie Gibson who agrees for LP under fluoro. Appreciate his involvement.   LP: IMPRESSION:  Technically successful fluoroscopically guided L4-L5 lumbar  puncture. The patient tolerated the procedure well without immediate  postprocedure complication.     Opening pressure 40 cm water.     27 mL of CSF obtained for laboratory studies.     Closing pressure 16 cm water.   At shift change case signed out to Dr. Bernette Mayers who will follow up with neurology pending recommendations and discharge pt accordingly.   Final Clinical Impression(s) / ED Diagnoses Final diagnoses:  Intracranial pressure increased    Rx / DC Orders ED Discharge Orders     None        Tanda Rockers, PA-C 08/05/20 1856    Rolan Bucco, MD 08/08/20 1506

## 2020-08-09 LAB — CSF CULTURE W GRAM STAIN: Culture: NO GROWTH

## 2020-08-26 ENCOUNTER — Encounter: Payer: Self-pay | Admitting: Neurology

## 2020-08-26 ENCOUNTER — Ambulatory Visit: Payer: BC Managed Care – PPO | Admitting: Neurology

## 2020-08-26 VITALS — BP 124/85 | HR 72 | Ht 66.0 in | Wt 220.6 lb

## 2020-08-26 DIAGNOSIS — G932 Benign intracranial hypertension: Secondary | ICD-10-CM | POA: Diagnosis not present

## 2020-08-26 MED ORDER — ACETAZOLAMIDE 250 MG PO TABS: 500.0000 mg | ORAL_TABLET | Freq: Two times a day (BID) | ORAL | 3 refills | Status: AC

## 2020-08-26 NOTE — Progress Notes (Signed)
Chief Complaint  Patient presents with   ED REFERRAL IIH      ASSESSMENT AND PLAN  Allyah Heather is a 35 y.o. female   Idiopathic intracranial hypertension  Lumbar puncture on August 05, 2020  was 40 cmH2O, symptoms has much improved following lumbar puncture   Refill her Diamox 250 mg 2 tablets twice a day  Continue follow-up with her ophthalmologist  Call clinic for worsening symptoms  Return to clinic in 4 to 6 months with Sarah  Laboratory evaluation to rule out treatable etiology  DIAGNOSTIC DATA (LABS, IMAGING, TESTING) - I reviewed patient records, labs, notes, testing and imaging myself where available.  Fluoroscopy guided lumbar puncture August 05, 2020, opening pressure of 40 cmH2O  MRI brain was normal on August 05 2020  MRI ORBITS August 05 2020  Orbits: There is no proptosis. No intraorbital mass. Possible mild protrusion of the left optic nerve head at the posterior globe in keeping with reported papilledema. Globes, extraocular muscles, and lacrimal glands are symmetric and unremarkable. There is no abnormal enhancement of the optic nerves.  MRV on August 05 2020: No mass or abnormal enhancement.  No evidence of venous thrombosis.   Bilateral distal transverse sinus stenosis. Nonspecific but can be seen in the setting of idiopathic intracranial hypertension. Additional associated finding of an empty sella is not present.    Laboratory evaluation in June 2022: Normal BMP, CBC, with exception of low normal hemoglobin of 11.9,  CSF, WBC 1, total protein of 26   HISTORICAL  Satoya Feeley, is a 35 year old female seen in request by emergency room and her primary care PA Jarrett Soho, PA-C for evaluation of idiopathic intracranial hypertension  I reviewed and summarized the referring note.  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  2 weeks later on August 05, 2020, she return to [facility] for repeat examination, was noted to have significant worsening of bilateral papillary edema was referred to emergency room,  Patient did report during 2-week spans, she had worsening symptoms, had more frequent occurrence of difficulty refocusing with sudden positional change, also complains of blurriness at bilateral peripheral visual field,  At emergency room, she had normal MRI of the brain, MRI of orbits, MRV, laboratory evaluation showed normal BMP, CBC with low normal hemoglobin of 11.9, CSF showed no significant abnormality  She had fluoroscopy guided lumbar puncture, opening pressure was 40 cmH2O, she reported immediate symptoms improvement, she was also put on Diamox 250 mg 4 tablets daily, she has some side effect initially, lightheadedness, change of the taste bud, tingling at her fingertips, now she is tolerating it better, she has more ophthalmology evaluation pending, reported only 1 episode when she spent hours in hot sunshine, she felt mild blurry vision peripheral visual field, rest of the time she has been doing well, in specific she denies headache, baseline high-pitched loud tinnitus, no hearing loss, no whooshing sound,  She denies a history of retinoid, or steroid use  PHYSICAL EXAM:   Vitals:   08/26/20  0940  BP: 124/85  Pulse: 72  Weight: 220 lb 9.6 oz (100.1 kg)  Height: 5\' 6"  (1.676 m)   Not recorded     Body mass index is 35.61 kg/m.  PHYSICAL EXAMNIATION:  Gen: NAD, conversant, well nourised, well groomed                      Cardiovascular: Regular rate rhythm, no peripheral edema, warm, nontender. Eyes: Conjunctivae clear without exudates or hemorrhage Neck: Supple, no carotid bruits. Pulmonary: Clear to auscultation bilaterally   NEUROLOGICAL EXAM:  MENTAL STATUS: Speech:    Speech is normal; fluent and spontaneous with normal comprehension.  Cognition:     Orientation to time, place and person     Normal recent and remote memory     Normal Attention span and concentration     Normal Language, naming, repeating,spontaneous speech     Fund of knowledge   CRANIAL NERVES: CN II: Visual fields are full to confrontation. Pupils are round equal and briskly reactive to light. CN III, IV, VI: extraocular movement are normal. No ptosis. CN V: Facial sensation is intact to light touch CN VII: Face is symmetric with normal eye closure  CN VIII: Hearing is normal to causal conversation. CN IX, X: Phonation is normal. CN XI: Head turning and shoulder shrug are intact  MOTOR: There is no pronator drift of out-stretched arms. Muscle bulk and tone are normal. Muscle strength is normal.  REFLEXES: Reflexes are 2+ and symmetric at the biceps, triceps, knees, and ankles. Plantar responses are flexor.  SENSORY: Intact to light touch, pinprick and vibratory sensation are intact in fingers and toes.  COORDINATION: There is no trunk or limb dysmetria noted.  GAIT/STANCE: Posture is normal. Gait is steady with normal steps, base, arm swing, and turning. Heel and toe walking are normal. Tandem gait is normal.  Romberg is absent.  REVIEW OF SYSTEMS:  Full 14 system review of systems performed and notable only for as above All other review of systems were negative.   ALLERGIES: No Known Allergies  HOME MEDICATIONS: Current Outpatient Medications  Medication Sig Dispense Refill   acetaZOLAMIDE (DIAMOX) 250 MG tablet Take 2 tablets (500 mg total) by mouth 2 (two) times daily. 120 tablet 0   Multiple Vitamin  (MULTIVITAMIN ADULT PO) Take 1 tablet by mouth daily.     Turmeric (QC TUMERIC COMPLEX PO) Take 1 tablet by mouth daily.     ZINC CITRATE PO Take 1 tablet by mouth daily.     mupirocin ointment (BACTROBAN) 2 % Apply to wound 3 times daily for 5 days (Patient not taking: No sig reported) 22 g 0   No current facility-administered medications for this visit.    PAST MEDICAL HISTORY: No past medical history on file.  PAST SURGICAL HISTORY: Past Surgical History:  Procedure Laterality Date   CESAREAN SECTION     2019    FAMILY HISTORY: Family History  Problem Relation Age of Onset   Healthy Mother    Healthy Father     SOCIAL HISTORY: Social History   Socioeconomic History   Marital status: Married    Spouse name: Not on file   Number of children: Not on file   Years of education: Not on file   Highest education level: Not on file  Occupational History   Not on file  Tobacco Use   Smoking status: Never   Smokeless tobacco: Never  Vaping Use   Vaping Use: Never  used  Substance and Sexual Activity   Alcohol use: Yes    Comment: 2-3 times a week   Drug use: No   Sexual activity: Not on file  Other Topics Concern   Not on file  Social History Narrative   Cafffeine 2-3 cups daily, education: Scientist, water quality,  [place]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 Ph.D.  [facility] [address] [place], [place] [address] Ph: [phone] Fax: [phone]  CC:  No referring provider defined for this encounter.  Jarrett Soho, PA-C

## 2020-08-30 LAB — COMPREHENSIVE METABOLIC PANEL
ALT: 12 IU/L (ref 0–32)
AST: 14 IU/L (ref 0–40)
Albumin/Globulin Ratio: 1.7 (ref 1.2–2.2)
Albumin: 4.7 g/dL (ref 3.8–4.8)
Alkaline Phosphatase: 81 IU/L (ref 44–121)
BUN/Creatinine Ratio: 14 (ref 9–23)
BUN: 12 mg/dL (ref 6–20)
Bilirubin Total: 0.4 mg/dL (ref 0.0–1.2)
CO2: 15 mmol/L — ABNORMAL LOW (ref 20–29)
Calcium: 9.3 mg/dL (ref 8.7–10.2)
Chloride: 109 mmol/L — ABNORMAL HIGH (ref 96–106)
Creatinine, Ser: 0.87 mg/dL (ref 0.57–1.00)
Globulin, Total: 2.8 g/dL (ref 1.5–4.5)
Glucose: 89 mg/dL (ref 65–99)
Potassium: 3.9 mmol/L (ref 3.5–5.2)
Sodium: 139 mmol/L (ref 134–144)
Total Protein: 7.5 g/dL (ref 6.0–8.5)
eGFR: 89 mL/min/{1.73_m2} (ref >59)

## 2020-08-30 LAB — IRON AND TIBC
Iron Saturation: 20 % (ref 15–55)
Iron: 71 ug/dL (ref 27–159)
Total Iron Binding Capacity: 351 ug/dL (ref 250–450)
UIBC: 280 ug/dL (ref 131–425)

## 2020-08-30 LAB — ANA W/REFLEX IF POSITIVE: Anti Nuclear Antibody (ANA): NEGATIVE

## 2020-08-30 LAB — HGB A1C W/O EAG: Hgb A1c MFr Bld: 5.3 % (ref 4.8–5.6)

## 2020-08-30 LAB — CBC WITH DIFFERENTIAL/PLATELET
Basophils Absolute: 0.1 10*3/uL (ref 0.0–0.2)
Basos: 1 %
EOS (ABSOLUTE): 0.2 10*3/uL (ref 0.0–0.4)
Eos: 3 %
Hematocrit: 38.5 % (ref 34.0–46.6)
Hemoglobin: 12.4 g/dL (ref 11.1–15.9)
Immature Grans (Abs): 0 10*3/uL (ref 0.0–0.1)
Immature Granulocytes: 0 %
Lymphocytes Absolute: 1.9 10*3/uL (ref 0.7–3.1)
Lymphs: 31 %
MCH: 27.3 pg (ref 26.6–33.0)
MCHC: 32.2 g/dL (ref 31.5–35.7)
MCV: 85 fL (ref 79–97)
Monocytes Absolute: 0.4 10*3/uL (ref 0.1–0.9)
Monocytes: 7 %
Neutrophils Absolute: 3.7 10*3/uL (ref 1.4–7.0)
Neutrophils: 58 %
Platelets: 225 10*3/uL (ref 150–450)
RBC: 4.54 x10E6/uL (ref 3.77–5.28)
RDW: 13.3 % (ref 11.7–15.4)
WBC: 6.3 10*3/uL (ref 3.4–10.8)

## 2020-08-30 LAB — COPPER, SERUM: Copper: 138 ug/dL (ref 80–158)

## 2020-08-30 LAB — C-REACTIVE PROTEIN: CRP: 3 mg/L (ref 0–10)

## 2020-08-30 LAB — THYROID PANEL WITH TSH
Free Thyroxine Index: 1.7 (ref 1.2–4.9)
T3 Uptake Ratio: 28 % (ref 24–39)
T4, Total: 5.9 ug/dL (ref 4.5–12.0)
TSH: 2.76 u[IU]/mL (ref 0.450–4.500)

## 2020-08-30 LAB — SEDIMENTATION RATE: Sed Rate: 27 mm/hr (ref 0–32)

## 2020-08-30 LAB — FERRITIN: Ferritin: 21 ng/mL (ref 15–150)

## 2020-08-30 LAB — HIV ANTIBODY (ROUTINE TESTING W REFLEX): HIV Screen 4th Generation wRfx: NONREACTIVE

## 2020-08-30 LAB — VITAMIN B12: Vitamin B-12: 694 pg/mL (ref 232–1245)

## 2020-08-30 LAB — VITAMIN D 25 HYDROXY (VIT D DEFICIENCY, FRACTURES): Vit D, 25-Hydroxy: 33.2 ng/mL (ref 30.0–100.0)

## 2020-08-30 LAB — RPR: RPR Ser Ql: NONREACTIVE

## 2020-08-30 LAB — FOLATE: Folate: >20 ng/mL (ref >3.0)

## 2021-03-06 NOTE — Progress Notes (Signed)
PATIENT: Judith Colon DOB: 26-May-1985  REASON FOR VISIT: Follow up for IIH HISTORY FROM: Patient PRIMARY NEUROLOGIST: Dr. Terrace ArabiaYan   HISTORY  Judith Colon, is a 36 year old female seen in request by emergency room and her primary care PA Jarrett SohoWharton, Courtney, PA-C for evaluation of idiopathic intracranial hypertension   I reviewed and summarized the referring note.   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  2 weeks later on August 05, 2020, she return to [facility] for repeat examination, was noted to have significant worsening of bilateral papillary edema was referred to emergency room,  Patient did report during 2-week spans, she had worsening symptoms, had more frequent occurrence of difficulty refocusing with sudden positional change, also complains of blurriness at bilateral peripheral visual field,  At emergency room, she had normal MRI of the brain, MRI of orbits, MRV, laboratory evaluation showed normal BMP, CBC with low normal hemoglobin of 11.9, CSF showed no significant abnormality  She had fluoroscopy guided lumbar puncture, opening pressure was 40 cmH2O, she reported immediate symptoms improvement, she was also put on Diamox 250 mg 4 tablets daily, she has some side effect initially, lightheadedness, change of the taste bud, tingling at her fingertips, now she is tolerating it better, she has more ophthalmology evaluation pending, reported only 1 episode when she spent hours in hot sunshine,  she felt mild blurry vision peripheral visual field, rest of the time she has been doing well, in specific she denies headache, baseline high-pitched loud tinnitus, no hearing loss, no whooshing sound,   She denies a history of retinoid, or steroid use   Update March 07, 2021 SS: Here today alone, has lost about 20 lbs. No further symptoms of blurry vision or darkening of vision with standing, no headaches since LP. On Diamox 500 mg twice daily. Side effect of tingling. [place]  Saw ophthalmology March 02, 2021 Dr. Cleon Dewuggan, optic nerve edema greater on the left than the right, the left eye showed moderate to severe edema, the right showed mild diffuse edema, return in 3 months.  Extensive laboratory evaluation in July 2022 including B12, RPR, HIV, folate, CRP, sed rate, A1c, thyroid panel, ANA, copper, vitamin D, iron panel, ferritin, CBC, CMP showed no significant abnormalities.  REVIEW OF SYSTEMS: Out of a complete 14 system review of symptoms, the patient complains only of the following symptoms, and all other reviewed systems are negative.  See HPI  ALLERGIES: No Known Allergies  HOME MEDICATIONS: Outpatient Medications Prior to Visit  Medication Sig Dispense Refill   acetaZOLAMIDE (DIAMOX) 250 MG tablet Take 2 tablets (500 mg total) by mouth 2 (two) times daily. 360 tablet 3   Multiple Vitamin (MULTIVITAMIN ADULT PO) Take 1 tablet by mouth daily.     Turmeric (QC TUMERIC COMPLEX PO) Take 1 tablet by mouth daily.     ZINC CITRATE PO Take 1 tablet by mouth daily.     mupirocin ointment (BACTROBAN) 2 % Apply to wound 3 times daily for 5 days (Patient  not taking: No sig reported) 22 g 0   No facility-administered medications prior to visit.    PAST MEDICAL HISTORY: History reviewed. No pertinent past medical history.  PAST SURGICAL HISTORY: Past Surgical History:  Procedure Laterality Date   CESAREAN SECTION     2019    FAMILY HISTORY: Family History   Problem Relation Age of Onset   Healthy Mother    Healthy Father     SOCIAL HISTORY: Social History   Socioeconomic History   Marital status: Married    Spouse name: Not on file   Number of children: Not on file   Years of education: Not on file   Highest education level: Not on file  Occupational History   Not on file  Tobacco Use   Smoking status: Never   Smokeless tobacco: Never  Vaping Use   Vaping Use: Never used  Substance and Sexual Activity   Alcohol use: Yes    Comment: 2-3 times a week   Drug use: No   Sexual activity: Not on file  Other Topics Concern   Not on file  Social History Narrative   Cafffeine 2-3 cups daily, education: Scientist, water quality,  [place]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  Generalized: Well developed, in no acute distress   Neurological examination  Mentation: Alert oriented to time, place, history taking. Follows all commands speech and language fluent Cranial nerve II-XII: Pupils were equal round reactive to light. Extraocular movements were full, visual field were full on confrontational test. Facial sensation and strength were normal. Head turning and shoulder shrug  were normal and symmetric. Motor: The motor testing reveals 5 over 5 strength of all 4 extremities. Good symmetric motor tone is noted throughout.  Sensory: Sensory testing is intact to soft touch on all 4 extremities. No evidence of extinction is noted.  Coordination: Cerebellar testing reveals good finger-nose-finger and heel-to-shin bilaterally.  Gait and station:  Gait is normal. Tandem gait is normal.  Reflexes: Deep tendon reflexes are symmetric and normal bilaterally.   DIAGNOSTIC DATA (LABS, IMAGING, TESTING) - I reviewed patient records, labs, notes, testing and imaging myself where available.  Lab Results  Component Value Date   WBC 6.3 08/26/2020   HGB 12.4 08/26/2020   HCT 38.5 08/26/2020   MCV 85 08/26/2020   PLT 225 08/26/2020      Component Value Date/Time   NA 139 08/26/2020 1026   K 3.9 08/26/2020 1026   CL 109 (H) 08/26/2020 1026   CO2 15 (L) 08/26/2020 1026   GLUCOSE 89 08/26/2020 1026   GLUCOSE 100 (H) 08/05/2020 1018   BUN 12 08/26/2020 1026   CREATININE 0.87 08/26/2020 1026   CALCIUM 9.3 08/26/2020 1026   PROT 7.5 08/26/2020 1026   ALBUMIN 4.7 08/26/2020 1026   AST 14 08/26/2020 1026   ALT 12 08/26/2020 1026   ALKPHOS 81 08/26/2020 1026   BILITOT 0.4 08/26/2020 1026   GFRNONAA >60 08/05/2020 1018   No results found for: CHOL, HDL, LDLCALC, LDLDIRECT, TRIG,  [facility] Lab Results  Component Value Date   HGBA1C 5.3 08/26/2020   Lab Results  Component Value Date   VITAMINB12 694 08/26/2020   Lab Results  Component Value Date   TSH 2.760 08/26/2020   ASSESSMENT AND PLAN 36 y.o. year old female  has no past medical history on file. here with:  1.  Idiopathic intracranial hypertension -Ophthalmology evaluation 03/02/2021 showed continued moderate to severe edema of the left eye, mild diffuse edema on the right on Diamox 500 mg twice a day -Will order LP with opening pressure given ophthalmology evaluation, she has no clinical symptoms -Referral to establish with neurology in her new town of [place], will go ahead and schedule 4 to 56-month follow-up at our office to ensure not lost to follow-up, remain on Diamox 250 mg, 2 tablets twice a day -Has done well to lose about 20 pounds, discussed importance of continued weight loss -LP August 05, 2020, opening pressure was 40 cmH2O, symptoms much improved following  LP -Continue follow-up with ophthalmology in 3 month -Call our office for any worsening symptoms  Orders Placed This Encounter  Procedures   DG FL GUIDED LUMBAR PUNCTURE   Ambulatory referral to Neurology   Margie Ege, AGNP-C, DNP 03/07/2021, 9:45 AM [facility] [address] [place], [place] [address] [phone]

## 2021-03-07 ENCOUNTER — Encounter: Payer: Self-pay | Admitting: Neurology

## 2021-03-07 ENCOUNTER — Ambulatory Visit: Payer: BC Managed Care – PPO | Admitting: Neurology

## 2021-03-07 VITALS — BP 108/64 | HR 75 | Ht 66.0 in | Wt 215.0 lb

## 2021-03-07 DIAGNOSIS — G932 Benign intracranial hypertension: Secondary | ICD-10-CM | POA: Diagnosis not present

## 2021-03-07 NOTE — Progress Notes (Signed)
Chart reviewed, agree above plan, discussed plan with Maralyn Sago

## 2021-03-07 NOTE — Patient Instructions (Addendum)
Continue current medications Referral to neurology in your new area  Continue to see your eye doctor regularly  Call for any worsening or return of symptoms Continue to work on weight loss Will order LP to determine opening pressure, will be done at [facility]

## 2021-03-09 ENCOUNTER — Telehealth: Payer: Self-pay | Admitting: Neurology

## 2021-03-09 NOTE — Telephone Encounter (Signed)
Neurology referral has been sent to [facility] in [place], [place]. Phone: [phone].

## 2021-03-10 ENCOUNTER — Other Ambulatory Visit: Payer: Self-pay

## 2021-03-27 ENCOUNTER — Other Ambulatory Visit: Payer: BC Managed Care – PPO

## 2021-08-15 ENCOUNTER — Ambulatory Visit: Payer: BC Managed Care – PPO | Admitting: Neurology

## 2021-09-21 ENCOUNTER — Other Ambulatory Visit: Payer: Self-pay | Admitting: Neurology

## 2022-07-28 IMAGING — MR MR ORBITS WO/W CM
4 of 6 series · 18 of 48 positions shown · IV contrast (gadavist)
Comparison: None.

CLINICAL DATA: Optic nerve edema

EXAM:
MRI HEAD AND ORBITS WITHOUT AND WITH CONTRAST
MRV HEAD WITHOUT AND WITH CONTRAST
TECHNIQUE: Multiplanar, multiecho pulse sequences of the brain and surrounding
structures were obtained without and with intravenous contrast.
Multiplanar, multiecho pulse sequences of the orbits and surrounding
structures were obtained including fat saturation techniques, before
and after intravenous contrast administration. MRV of the head was
performed without and with intravenous contrast. Maximum intensity
projection images were created for better evaluation of the vessels.
CONTRAST:  10mL GADAVIST GADOBUTROL 1 MMOL/ML IV SOLN

[Series 12: T2 fat-sat · axial · 3.0mm · 0.35mm/px · z∈[-52,-6]mm · 3 of 15 slices shown (1 of 2)]
[im 1/15]
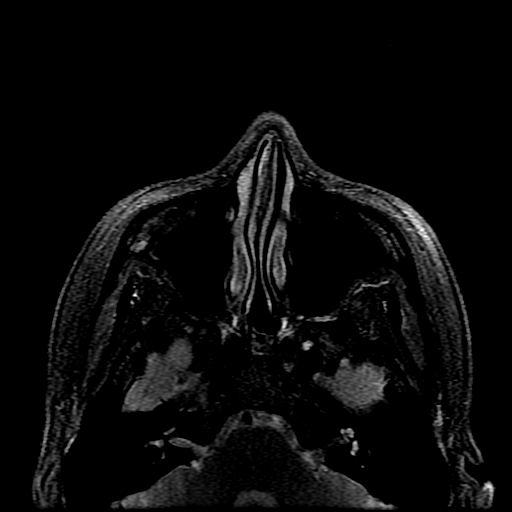
[im 8/15]
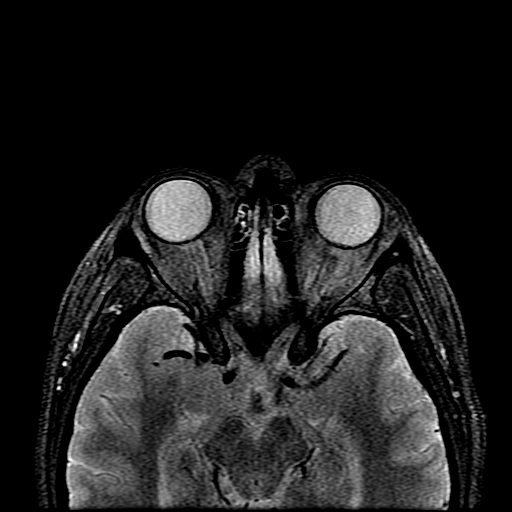
[im 15/15]
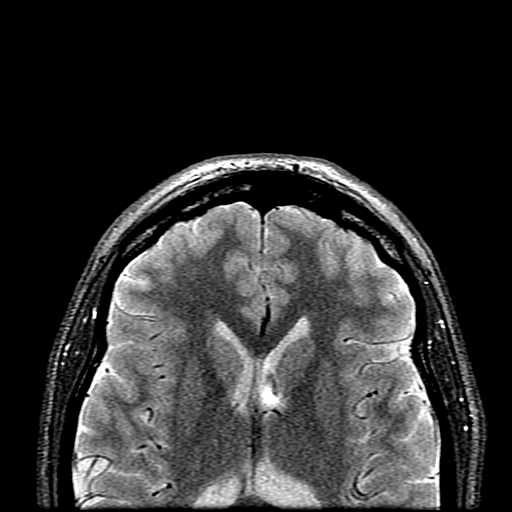

[Series 13: T2 fat-sat · coronal · 3.0mm · 0.35mm/px · 3 of 29 slices shown (2 of 2)]
[im 3/29]
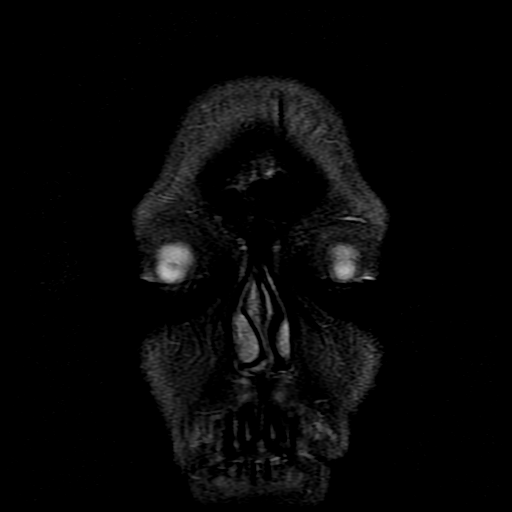
[im 15/29]
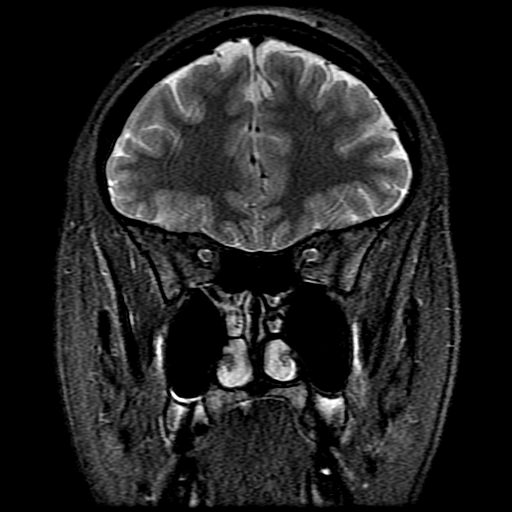
[im 26/29]
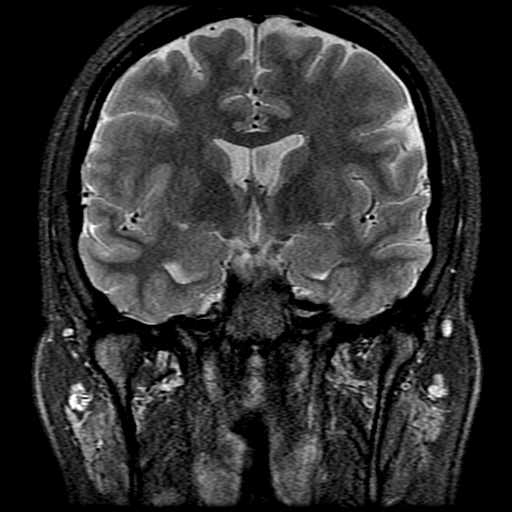

[Series 18: T1 post-contrast · axial · 3.0mm · 0.35mm/px · z∈[-52,-6]mm · 5 of 15 slices shown (1 of 2)]
[im 1/15]
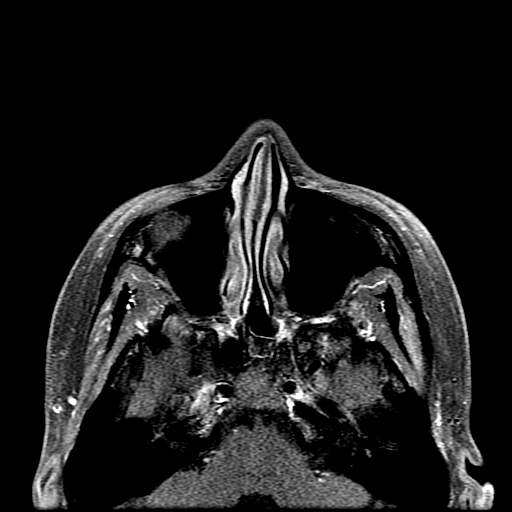
[im 4/15]
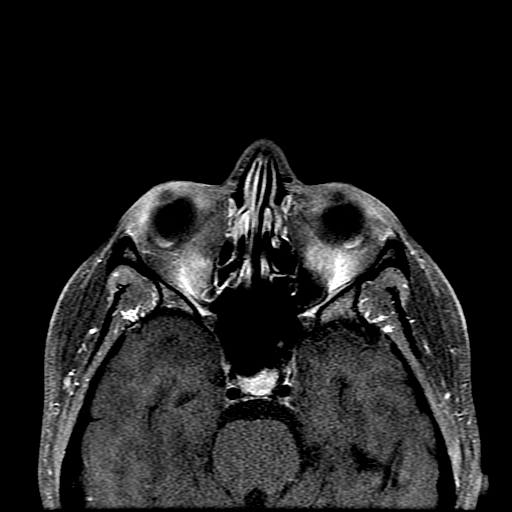
[im 8/15]
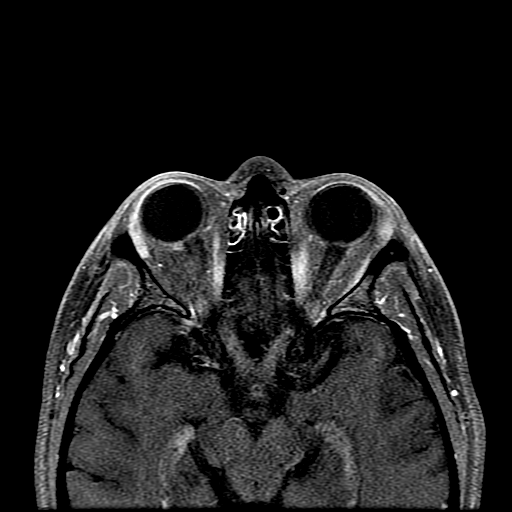
[im 11/15]
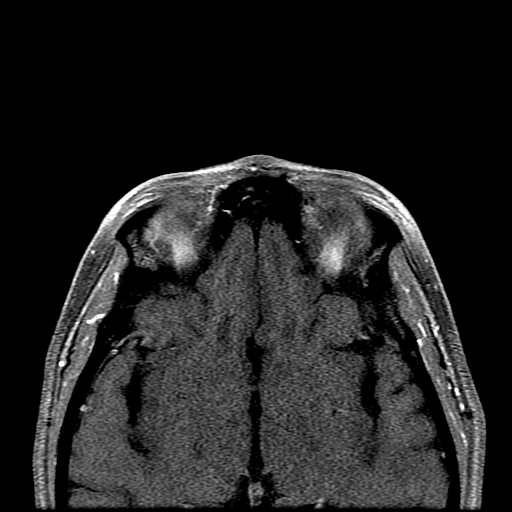
[im 15/15]
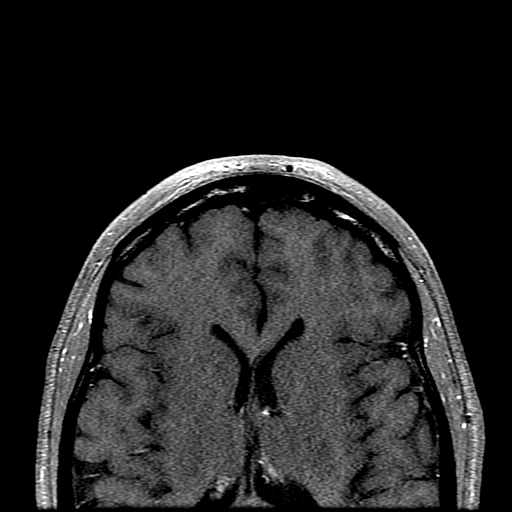

[Series 19: T1 post-contrast · coronal · 3.0mm · 0.35mm/px · 7 of 29 slices shown (2 of 2)]
[im 1/29]
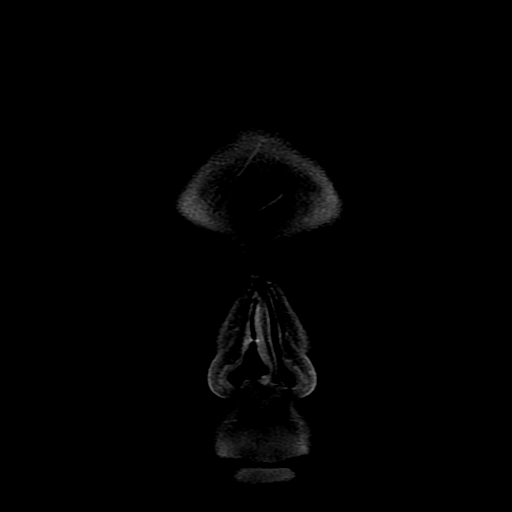
[im 3/29]
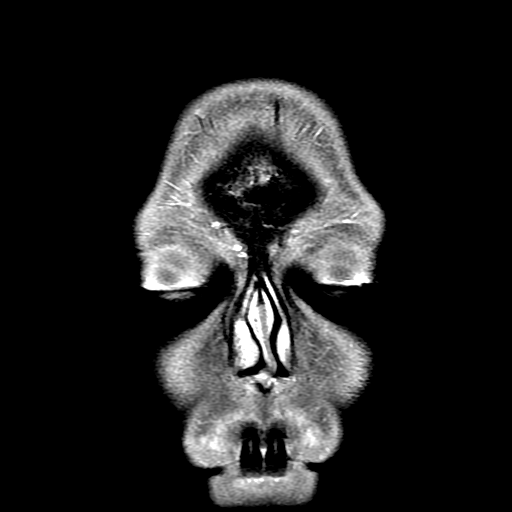
[im 6/29]
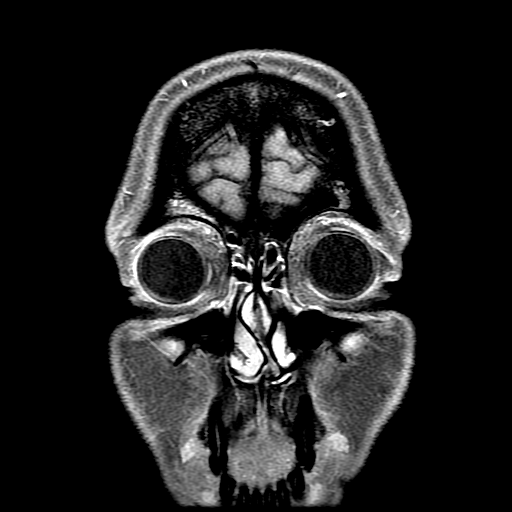
[im 9/29]
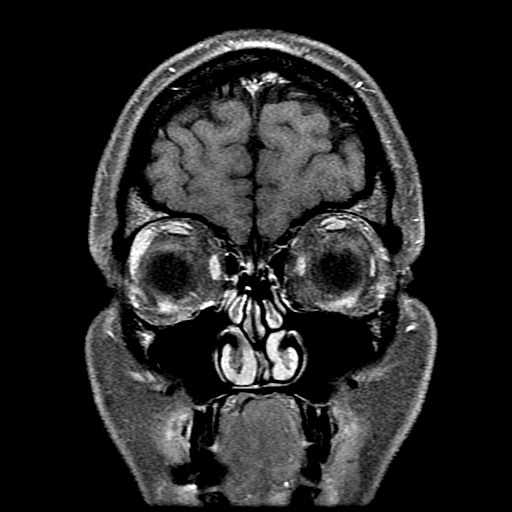
[im 12/29]
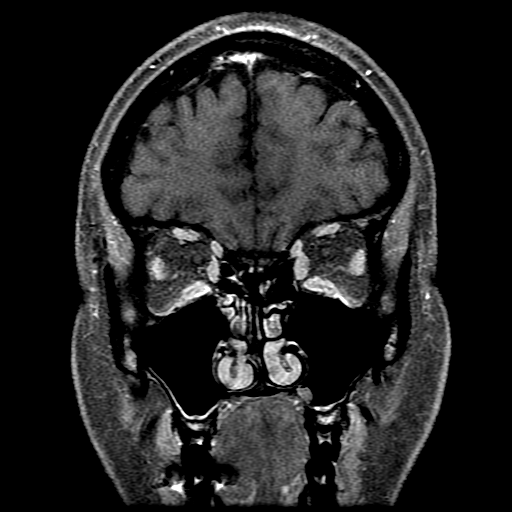
[im 15/29]
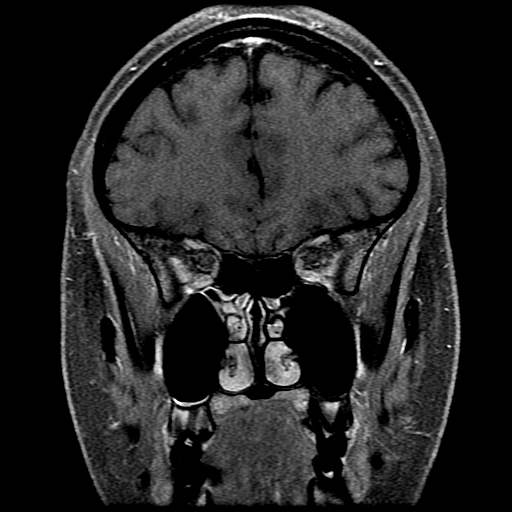
[im 26/29]
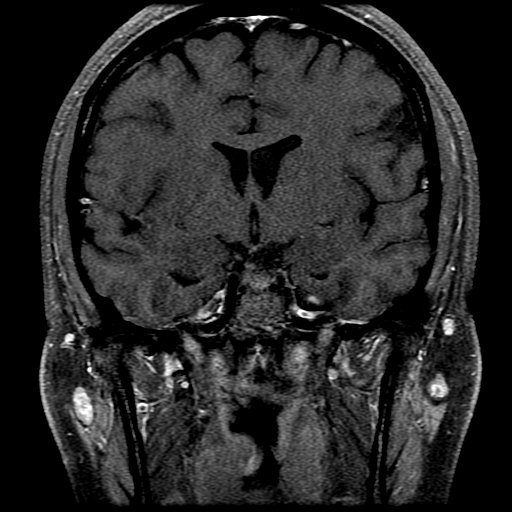

[18 of 48 positions shown; findings below may reference images not displayed]

FINDINGS: MRI HEAD

Brain: There is no acute infarction or intracranial hemorrhage.
Ventricles and sulci are normal in size and configuration. There is
no intracranial mass, mass effect, hydrocephalus, or extra-axial
collection. No abnormal enhancement.

Vascular: Major vessel flow voids at the skull base are preserved.

Skull and upper cervical spine: Marrow signal is within normal
limits.

Other: Mastoid air cells are clear.  Sella is unremarkable.

MRI ORBITS

Orbits: There is no proptosis. No intraorbital mass. Possible mild
protrusion of the left optic nerve head at the posterior globe in
keeping with reported papilledema. Globes, extraocular muscles, and
lacrimal glands are symmetric and unremarkable. There is no abnormal
enhancement of the optic nerves.

Visualized sinuses: Minor mucosal thickening.

Soft tissues: Unremarkable.

MRV HEAD

Superior sagittal sinus, straight sinus, vein of Giorgi, and internal
cerebral veins are patent. Transverse and sigmoid sinuses are
patent. There is bilateral stenosis of the distal transverse
sinuses. No evidence of sinus thrombosis.
IMPRESSION: No mass or abnormal enhancement.  No evidence of venous thrombosis.

Bilateral distal transverse sinus stenosis. Nonspecific but can be
seen in the setting of idiopathic intracranial hypertension.
Additional associated finding of an empty sella is not present.
